# Patient Record
Sex: Female | Born: 1960 | Race: White | Hispanic: No | Marital: Married | Smoking: Never smoker
Health system: Southern US, Community
[De-identification: ages and names within clinical notes are randomized; demographics above are authoritative.]

## PROBLEM LIST (undated history)

## (undated) DIAGNOSIS — I10 Essential (primary) hypertension: Secondary | ICD-10-CM

---

## 2007-05-05 ENCOUNTER — Ambulatory Visit: Payer: Self-pay

## 2008-05-06 ENCOUNTER — Ambulatory Visit: Payer: Self-pay

## 2008-05-11 ENCOUNTER — Ambulatory Visit: Payer: Self-pay

## 2010-07-31 ENCOUNTER — Ambulatory Visit: Payer: Self-pay

## 2010-11-23 ENCOUNTER — Ambulatory Visit: Payer: Self-pay | Admitting: Family Medicine

## 2011-10-13 ENCOUNTER — Ambulatory Visit: Payer: Self-pay | Admitting: Internal Medicine

## 2012-02-24 ENCOUNTER — Ambulatory Visit: Payer: Self-pay | Admitting: Internal Medicine

## 2012-10-30 ENCOUNTER — Ambulatory Visit: Payer: Self-pay | Admitting: Family Medicine

## 2012-10-30 LAB — CBC WITH DIFFERENTIAL/PLATELET
Basophil #: 0.1 10*3/uL (ref 0.0–0.1)
Basophil %: 0.8 %
Eosinophil #: 0 10*3/uL (ref 0.0–0.7)
Eosinophil %: 0.3 %
HCT: 40 % (ref 35.0–47.0)
HGB: 13.6 g/dL (ref 12.0–16.0)
Lymphocyte %: 17.7 %
MCH: 29.7 pg (ref 26.0–34.0)
MCHC: 34.1 g/dL (ref 32.0–36.0)
Monocyte #: 1.1 x10 3/mm — ABNORMAL HIGH (ref 0.2–0.9)
Monocyte %: 10.7 %
Neutrophil #: 7.1 10*3/uL — ABNORMAL HIGH (ref 1.4–6.5)
Neutrophil %: 70.5 %
RBC: 4.59 10*6/uL (ref 3.80–5.20)
RDW: 13.1 % (ref 11.5–14.5)

## 2012-10-30 LAB — URINALYSIS, COMPLETE
Glucose,UR: NEGATIVE mg/dL (ref 0–75)
Leukocyte Esterase: NEGATIVE
Nitrite: NEGATIVE
Protein: NEGATIVE
Specific Gravity: 1.005 (ref 1.003–1.030)

## 2012-10-30 LAB — RAPID INFLUENZA A&B ANTIGENS

## 2014-07-13 ENCOUNTER — Ambulatory Visit: Payer: Self-pay | Admitting: Gastroenterology

## 2014-07-15 ENCOUNTER — Ambulatory Visit: Payer: Self-pay | Admitting: Obstetrics and Gynecology

## 2015-06-29 ENCOUNTER — Other Ambulatory Visit: Payer: Self-pay | Admitting: Obstetrics and Gynecology

## 2015-06-29 DIAGNOSIS — Z1231 Encounter for screening mammogram for malignant neoplasm of breast: Secondary | ICD-10-CM

## 2015-07-19 ENCOUNTER — Ambulatory Visit
Admission: RE | Admit: 2015-07-19 | Discharge: 2015-07-19 | Disposition: A | Discharge status: Home / Self Care | Payer: BLUE CROSS/BLUE SHIELD | Source: Ambulatory Visit | Attending: Obstetrics and Gynecology | Admitting: Obstetrics and Gynecology

## 2015-07-19 DIAGNOSIS — Z1231 Encounter for screening mammogram for malignant neoplasm of breast: Secondary | ICD-10-CM | POA: Diagnosis not present

## 2016-03-04 ENCOUNTER — Ambulatory Visit
Admission: EM | Admit: 2016-03-04 | Discharge: 2016-03-04 | Disposition: A | Discharge status: Home / Self Care | Payer: BLUE CROSS/BLUE SHIELD | Attending: Family Medicine | Admitting: Family Medicine

## 2016-03-04 ENCOUNTER — Encounter: Payer: Self-pay | Admitting: Emergency Medicine

## 2016-03-04 DIAGNOSIS — B9789 Other viral agents as the cause of diseases classified elsewhere: Principal | ICD-10-CM

## 2016-03-04 DIAGNOSIS — J069 Acute upper respiratory infection, unspecified: Secondary | ICD-10-CM

## 2016-03-04 HISTORY — DX: Essential (primary) hypertension: I10

## 2016-03-04 LAB — RAPID INFLUENZA A&B ANTIGENS (ARMC ONLY): INFLUENZA B (ARMC): NEGATIVE

## 2016-03-04 LAB — RAPID INFLUENZA A&B ANTIGENS: Influenza A (ARMC): NEGATIVE

## 2016-03-04 MED ORDER — GUAIFENESIN-CODEINE 100-10 MG/5ML PO SOLN: ORAL | Status: DC

## 2016-03-04 NOTE — ED Notes (Signed)
Patient c/o cough and chest congestion that started yesterday.  Patient reports fevers.

## 2016-03-04 NOTE — ED Provider Notes (Signed)
CSN: [id number]     Arrival date & time 03/04/16  0831 History   None    Chief Complaint  Patient presents with  . Cough   (Consider location/radiation/quality/duration/timing/severity/associated sxs/prior Treatment) Patient is a 55 y.o. female presenting with cough and URI. The history is provided by the patient.  Cough Associated symptoms: fever and rhinorrhea   Associated symptoms: no headaches, no sore throat and no wheezing   URI Presenting symptoms: congestion, cough, fever and rhinorrhea   Presenting symptoms: no sore throat   Severity:  Moderate Onset quality:  Sudden Duration:  1 day Timing:  Constant Progression:  Unchanged Chronicity:  New Relieved by:  Nothing Ineffective treatments:  None tried Associated symptoms: no headaches, no sinus pain and no wheezing   Risk factors: sick contacts   Risk factors: not elderly, no chronic cardiac disease, no chronic kidney disease, no chronic respiratory disease, no diabetes mellitus, no immunosuppression, no recent illness and no recent travel     Past Medical History  Diagnosis Date  . Hypertension    Past Surgical History  Procedure Laterality Date  . Cesarean section     Family History  Problem Relation Age of Onset  . Breast cancer Maternal Aunt 5290   Social History  Substance Use Topics  . Smoking status: Never Smoker   . Smokeless tobacco: None  . Alcohol Use: No   OB History    No data available     Review of Systems  Constitutional: Positive for fever.  HENT: Positive for congestion and rhinorrhea. Negative for sore throat.   Respiratory: Positive for cough. Negative for wheezing.   Neurological: Negative for headaches.    Allergies  Review of patient's allergies indicates no known allergies.  Home Medications   Prior to Admission medications   Medication Sig Start Date End Date Taking? Authorizing Provider  amLODipine (NORVASC) 5 MG tablet Take 5 mg by mouth daily.   Yes Historical Provider,  MD  guaiFENesin-codeine 100-10 MG/5ML syrup 1-2 teaspoons po q 8 hours prn 03/04/16   Payton Mccallumrlando Deshannon Hinchliffe, MD   Meds Ordered and Administered this Visit  Medications - No data to display  BP 131/88 mmHg  Pulse 85  Temp(Src) 99.9 F (37.7 C) (Tympanic)  Resp 16  Ht 5\' 5"  (1.651 m)  Wt 198 lb (89.812 kg)  BMI 32.95 kg/m2  SpO2 98% No data found.   Physical Exam  Constitutional: She is oriented to person, place, and time. She appears well-developed and well-nourished. No distress.  HENT:  Head: Normocephalic.  Right Ear: Tympanic membrane, external ear and ear canal normal.  Left Ear: Tympanic membrane, external ear and ear canal normal.  Nose: Rhinorrhea present.  Mouth/Throat: Mucous membranes are normal. Posterior oropharyngeal erythema present. No oropharyngeal exudate, posterior oropharyngeal edema or tonsillar abscesses.  Eyes: Conjunctivae and EOM are normal. Pupils are equal, round, and reactive to light. Right eye exhibits no discharge. Left eye exhibits no discharge. No scleral icterus.  Neck: Normal range of motion. Neck supple. No JVD present. No tracheal deviation present. No thyromegaly present.  Cardiovascular: Normal rate, regular rhythm, normal heart sounds and intact distal pulses.   No murmur heard. Pulmonary/Chest: Effort normal and breath sounds normal. No stridor. No respiratory distress. She has no wheezes. She has no rales.  Lymphadenopathy:    She has no cervical adenopathy.  Neurological: She is alert and oriented to person, place, and time.  Skin: No rash noted. She is not diaphoretic.  Nursing note and vitals  reviewed.   ED Course  Procedures (including critical care time)  Labs Review Labs Reviewed  RAPID INFLUENZA A&B ANTIGENS [facility] ONLY)    Imaging Review No results found.   Visual Acuity Review  Right Eye Distance:   Left Eye Distance:   Bilateral Distance:    Right Eye Near:   Left Eye Near:    Bilateral Near:         MDM   1.  Viral URI with cough    Discharge Medication List as of 03/04/2016 10:11 AM    START taking these medications   Details  guaiFENesin-codeine 100-10 MG/5ML syrup 1-2 teaspoons po q 8 hours prn, Print       1. Lab results (negative flu) and diagnosis reviewed with patient 2. rx as per orders above; reviewed possible side effects, interactions, risks and benefits  3. Recommend supportive treatment with increased fluids, rest 4. Follow-up prn if symptoms worsen or don't improve    Payton Mccallum, MD 03/04/16 1020

## 2016-06-26 ENCOUNTER — Other Ambulatory Visit: Payer: Self-pay | Admitting: Obstetrics and Gynecology

## 2016-06-26 DIAGNOSIS — Z1231 Encounter for screening mammogram for malignant neoplasm of breast: Secondary | ICD-10-CM

## 2016-07-23 ENCOUNTER — Ambulatory Visit: Admission: RE | Admit: 2016-07-23 | Payer: BLUE CROSS/BLUE SHIELD | Source: Ambulatory Visit

## 2016-07-24 ENCOUNTER — Ambulatory Visit
Admission: RE | Admit: 2016-07-24 | Discharge: 2016-07-24 | Disposition: A | Discharge status: Home / Self Care | Payer: BLUE CROSS/BLUE SHIELD | Source: Ambulatory Visit | Attending: Obstetrics and Gynecology | Admitting: Obstetrics and Gynecology

## 2016-07-24 ENCOUNTER — Other Ambulatory Visit: Payer: Self-pay | Admitting: Obstetrics and Gynecology

## 2016-07-24 DIAGNOSIS — Z1231 Encounter for screening mammogram for malignant neoplasm of breast: Secondary | ICD-10-CM | POA: Diagnosis present

## 2017-01-12 ENCOUNTER — Ambulatory Visit
Admission: EM | Admit: 2017-01-12 | Discharge: 2017-01-12 | Disposition: A | Discharge status: Home / Self Care | Payer: BLUE CROSS/BLUE SHIELD | Attending: Family Medicine | Admitting: Family Medicine

## 2017-01-12 ENCOUNTER — Encounter: Payer: Self-pay | Admitting: Emergency Medicine

## 2017-01-12 DIAGNOSIS — R69 Illness, unspecified: Secondary | ICD-10-CM | POA: Diagnosis not present

## 2017-01-12 DIAGNOSIS — J111 Influenza due to unidentified influenza virus with other respiratory manifestations: Secondary | ICD-10-CM

## 2017-01-12 MED ORDER — HYDROCOD POLST-CPM POLST ER 10-8 MG/5ML PO SUER: 5.0000 mL | Freq: Two times a day (BID) | ORAL | 0 refills | Status: DC | PRN

## 2017-01-12 MED ORDER — IBUPROFEN 800 MG PO TABS
800.0000 mg | ORAL_TABLET | Freq: Once | ORAL | Status: AC
  Administered 2017-01-12: 800 mg via ORAL

## 2017-01-12 MED ORDER — OSELTAMIVIR PHOSPHATE 75 MG PO CAPS: 75.0000 mg | ORAL_CAPSULE | Freq: Two times a day (BID) | ORAL | 0 refills | Status: DC

## 2017-01-12 NOTE — ED Provider Notes (Signed)
[facility]    CSN: [id number] Arrival date & time: 01/12/17  1604     History   Chief Complaint Chief Complaint  Patient presents with  . Cough  . Fever    HPI Anita Clayton is a 56 y.o. female.    Cough  Associated symptoms: fever and myalgias   Associated symptoms: no wheezing   Fever  Associated symptoms: congestion, cough and myalgias   URI  Presenting symptoms: congestion, cough, fatigue and fever   Severity:  Moderate Onset quality:  Sudden Duration:  1 day Timing:  Constant Progression:  Worsening Chronicity:  New Relieved by:  None tried Associated symptoms: myalgias   Associated symptoms: no sinus pain and no wheezing   Risk factors: sick contacts (flu contacts at work place (school))   Risk factors: not elderly, no chronic cardiac disease, no chronic kidney disease, no chronic respiratory disease, no diabetes mellitus, no immunosuppression, no recent illness and no recent travel     Past Medical History:  Diagnosis Date  . Hypertension     There are no active problems to display for this patient.   Past Surgical History:  Procedure Laterality Date  . CESAREAN SECTION      OB History    No data available       Home Medications    Prior to Admission medications   Medication Sig Start Date End Date Taking? Authorizing Provider  amLODipine (NORVASC) 5 MG tablet Take 5 mg by mouth daily.    Historical Provider, MD  chlorpheniramine-HYDROcodone (TUSSIONEX PENNKINETIC ER) 10-8 MG/5ML SUER Take 5 mLs by mouth every 12 (twelve) hours as needed. 01/12/17   Payton Mccallumrlando Lakeem Rozo, MD  oseltamivir (TAMIFLU) 75 MG capsule Take 1 capsule (75 mg total) by mouth 2 (two) times daily. 01/12/17   Payton Mccallumrlando Junella Domke, MD    Family History Family History  Problem Relation Age of Onset  . Breast cancer Maternal Aunt 1890    Social History Social History  Substance Use Topics  . Smoking status: Never Smoker  . Smokeless tobacco: Never Used  . Alcohol use  No     Allergies   Patient has no known allergies.   Review of Systems Review of Systems  Constitutional: Positive for fatigue and fever.  HENT: Positive for congestion. Negative for sinus pain.   Respiratory: Positive for cough. Negative for wheezing.   Musculoskeletal: Positive for myalgias.     Physical Exam Triage Vital Signs ED Triage Vitals  Enc Vitals Group     BP 01/12/17 1617 (!) 182/98     Pulse Rate 01/12/17 1617 80     Resp 01/12/17 1617 16     Temp 01/12/17 1617 (!) 102.1 F (38.9 C)     Temp Source 01/12/17 1617 Oral     SpO2 01/12/17 1617 100 %     Weight 01/12/17 1616 198 lb (89.8 kg)     Height 01/12/17 1616 5\' 4"  (1.626 m)     Head Circumference --      Peak Flow --      Pain Score 01/12/17 1616 0     Pain Loc --      Pain Edu? --      Excl. in GC? --    No data found.   Updated Vital Signs BP (!) 160/80 (BP Location: Left Arm)   Pulse 80   Temp (!) 101.5 F (38.6 C) (Oral)   Resp 16   Ht 5\' 4"  (1.626 m)  Wt 198 lb (89.8 kg)   SpO2 100%   BMI 33.99 kg/m   Visual Acuity Right Eye Distance:   Left Eye Distance:   Bilateral Distance:    Right Eye Near:   Left Eye Near:    Bilateral Near:     Physical Exam  Constitutional: She appears well-developed and well-nourished. No distress.  HENT:  Head: Normocephalic and atraumatic.  Right Ear: Tympanic membrane, external ear and ear canal normal.  Left Ear: Tympanic membrane, external ear and ear canal normal.  Nose: Mucosal edema and rhinorrhea present. No nose lacerations, sinus tenderness, nasal deformity, septal deviation or nasal septal hematoma. No epistaxis.  No foreign bodies. Right sinus exhibits no maxillary sinus tenderness and no frontal sinus tenderness. Left sinus exhibits no maxillary sinus tenderness and no frontal sinus tenderness.  Mouth/Throat: Uvula is midline, oropharynx is clear and moist and mucous membranes are normal. No oropharyngeal exudate.  Eyes: Conjunctivae  and EOM are normal. Pupils are equal, round, and reactive to light. Right eye exhibits no discharge. Left eye exhibits no discharge. No scleral icterus.  Neck: Normal range of motion. Neck supple. No thyromegaly present.  Cardiovascular: Normal rate, regular rhythm and normal heart sounds.   Pulmonary/Chest: Effort normal and breath sounds normal. No respiratory distress. She has no wheezes. She has no rales.  Lymphadenopathy:    She has no cervical adenopathy.  Skin: She is not diaphoretic.  Nursing note and vitals reviewed.    UC Treatments / Results  Labs (all labs ordered are listed, but only abnormal results are displayed) Labs Reviewed - No data to display  EKG  EKG Interpretation None       Radiology No results found.  Procedures Procedures (including critical care time)  Medications Ordered in UC Medications  ibuprofen (ADVIL,MOTRIN) tablet 800 mg (800 mg Oral Given 01/12/17 1620)     Initial Impression / Assessment and Plan / UC Course  I have reviewed the triage vital signs and the nursing notes.  Pertinent labs & imaging results that were available during my care of the patient were reviewed by me and considered in my medical decision making (see chart for details).       Final Clinical Impressions(s) / UC Diagnoses   Final diagnoses:  Influenza-like illness    New Prescriptions Discharge Medication List as of 01/12/2017  5:11 PM    START taking these medications   Details  chlorpheniramine-HYDROcodone (TUSSIONEX PENNKINETIC ER) 10-8 MG/5ML SUER Take 5 mLs by mouth every 12 (twelve) hours as needed., Starting Sat 01/12/2017, Normal    oseltamivir (TAMIFLU) 75 MG capsule Take 1 capsule (75 mg total) by mouth 2 (two) times daily., Starting Sat 01/12/2017, Normal       1. diagnosis reviewed with patient 2. rx as per orders above; reviewed possible side effects, interactions, risks and benefits  3. Recommend supportive treatment with fluids, rest, otc  analgesics 4. Follow-up prn if symptoms worsen or don't improve   Payton Mccallum, MD 01/12/17 1715

## 2017-01-12 NOTE — ED Triage Notes (Signed)
Patient c/o cough, chest congestion, fever, and bodyaches that started last night.

## 2017-07-02 ENCOUNTER — Other Ambulatory Visit: Payer: Self-pay | Admitting: Obstetrics and Gynecology

## 2017-07-02 DIAGNOSIS — Z1231 Encounter for screening mammogram for malignant neoplasm of breast: Secondary | ICD-10-CM

## 2017-07-29 ENCOUNTER — Ambulatory Visit
Admission: RE | Admit: 2017-07-29 | Discharge: 2017-07-29 | Disposition: A | Discharge status: Home / Self Care | Payer: BLUE CROSS/BLUE SHIELD | Source: Ambulatory Visit | Attending: Obstetrics and Gynecology | Admitting: Obstetrics and Gynecology

## 2017-07-29 DIAGNOSIS — Z1231 Encounter for screening mammogram for malignant neoplasm of breast: Secondary | ICD-10-CM | POA: Diagnosis not present

## 2017-11-10 ENCOUNTER — Encounter: Payer: Self-pay | Admitting: Emergency Medicine

## 2017-11-10 ENCOUNTER — Ambulatory Visit
Admission: EM | Admit: 2017-11-10 | Discharge: 2017-11-10 | Disposition: A | Discharge status: Home / Self Care | Payer: BLUE CROSS/BLUE SHIELD | Attending: Emergency Medicine | Admitting: Emergency Medicine

## 2017-11-10 DIAGNOSIS — J0100 Acute maxillary sinusitis, unspecified: Secondary | ICD-10-CM

## 2017-11-10 DIAGNOSIS — H1032 Unspecified acute conjunctivitis, left eye: Secondary | ICD-10-CM | POA: Diagnosis not present

## 2017-11-10 MED ORDER — AMOXICILLIN-POT CLAVULANATE 875-125 MG PO TABS: 1.0000 | ORAL_TABLET | Freq: Two times a day (BID) | ORAL | 0 refills | Status: AC

## 2017-11-10 MED ORDER — POLYMYXIN B-TRIMETHOPRIM 10000-0.1 UNIT/ML-% OP SOLN: 1.0000 [drp] | OPHTHALMIC | 0 refills | Status: AC

## 2017-11-10 NOTE — Discharge Instructions (Signed)
Please take medications as prescribed.  Return to the clinic for any increasing pain, fevers, worsening symptoms or urgent changes in health.

## 2017-11-10 NOTE — ED Triage Notes (Signed)
Sinus pressure, cough, nasal drainage,  Facial feels clogged up for 1 week

## 2017-11-10 NOTE — ED Provider Notes (Signed)
[facility]    CSN: [id number] Arrival date & time: 11/10/17  1001     History   Chief Complaint Chief Complaint  Patient presents with  . Sinusitis    HPI Anita Clayton is a [age] y.o. female presents to the clinic for evaluation of maxillary sinus pain and pressure that has been increasing over the last 7 days.  She has had no improvement of sinus pain, congestion.  Pain is moderate.  She denies any cough, fevers, chest pain, short of breath, abdominal pain.  She has had some left eye redness with matting.  No vision changes.    HPI  Past Medical History:  Diagnosis Date  . Hypertension     There are no active problems to display for this patient.   Past Surgical History:  Procedure Laterality Date  . CESAREAN SECTION      OB History    No data available       Home Medications    Prior to Admission medications   Medication Sig Start Date End Date Taking? Authorizing Provider  amLODipine (NORVASC) 5 MG tablet Take 5 mg by mouth daily.   Yes [provider]  amoxicillin-clavulanate (AUGMENTIN) 875-125 MG tablet Take 1 tablet by mouth every 12 (twelve) hours for 10 days. 11/10/17 11/20/17  Evon SlackGaines, Kirrah Mustin C, PA-C  chlorpheniramine-HYDROcodone (TUSSIONEX PENNKINETIC ER) 10-8 MG/5ML SUER Take 5 mLs by mouth every 12 (twelve) hours as needed. 01/12/17   Payton Mccallumonty, [place], MD  oseltamivir (TAMIFLU) 75 MG capsule Take 1 capsule (75 mg total) by mouth 2 (two) times daily. 01/12/17   Payton Mccallumonty, [place], MD  trimethoprim-polymyxin b (POLYTRIM) ophthalmic solution Place 1 drop into the left eye every 4 (four) hours for 7 days. 11/10/17 11/17/17  Evon SlackGaines, Viviano Bir C, PA-C    Family History Family History  Problem Relation Age of Onset  . Breast cancer Maternal Aunt 90  . Hypertension Mother     Social History Social History   Tobacco Use  . Smoking status: Never Smoker  . Smokeless tobacco: Never Used  Substance Use Topics  . Alcohol use: No  . Drug use:  No     Allergies   Patient has no known allergies.   Review of Systems Review of Systems  Constitutional: Negative for fever.  HENT: Positive for congestion, rhinorrhea, sinus pressure, sinus pain and sore throat. Negative for ear discharge, trouble swallowing and voice change.   Eyes: Positive for discharge and redness. Negative for photophobia, pain and visual disturbance.  Respiratory: Negative for cough, shortness of breath, wheezing and stridor.   Cardiovascular: Negative for chest pain.  Gastrointestinal: Negative for abdominal pain, diarrhea, nausea and vomiting.  Genitourinary: Negative for dysuria, flank pain and pelvic pain.  Musculoskeletal: Positive for myalgias. Negative for back pain.  Skin: Negative for rash.  Neurological: Negative for dizziness and headaches.     Physical Exam Triage Vital Signs ED Triage Vitals  Enc Vitals Group     BP 11/10/17 1049 135/85     Pulse Rate 11/10/17 1049 79     Resp 11/10/17 1049 16     Temp 11/10/17 1049 98 F (36.7 C)     Temp Source 11/10/17 1049 Oral     SpO2 11/10/17 1049 98 %     Weight 11/10/17 1049 200 lb (90.7 kg)     Height 11/10/17 1049 5\' 5"  (1.651 m)     Head Circumference --      Peak Flow --  Pain Score 11/10/17 1051 4     Pain Loc --      Pain Edu? --      Excl. in GC? --    No data found.  Updated Vital Signs BP 135/85 (BP Location: Left Arm)   Pulse 79   Temp 98 F (36.7 C) (Oral)   Resp 16   Ht 5\' 5"  (1.651 m)   Wt 200 lb (90.7 kg)   SpO2 98%   BMI 33.28 kg/m   Visual Acuity Right Eye Distance:   Left Eye Distance:   Bilateral Distance:    Right Eye Near:   Left Eye Near:    Bilateral Near:     Physical Exam  Constitutional: She is oriented to person, place, and time. She appears well-developed and well-nourished. No distress.  HENT:  Head: Normocephalic and atraumatic.  Right Ear: Hearing, tympanic membrane, external ear and ear canal normal.  Left Ear: Hearing, tympanic  membrane, external ear and ear canal normal.  Nose: Rhinorrhea present.  Mouth/Throat: Mucous membranes are normal. No trismus in the jaw. No uvula swelling. Posterior oropharyngeal erythema present. No oropharyngeal exudate, posterior oropharyngeal edema or tonsillar abscesses. No tonsillar exudate.  Positive maxillary sinus tenderness to percussion.  No frontal sinus tenderness.  No pharyngeal erythema or exudates.  Eyes: Right eye exhibits no discharge. Left eye exhibits no discharge. Right conjunctiva is not injected. Left conjunctiva is injected.  Neck: Normal range of motion.  Cardiovascular: Normal rate and regular rhythm.  Pulmonary/Chest: Effort normal and breath sounds normal. No stridor. No respiratory distress. She has no wheezes. She has no rales.  Abdominal: Soft. She exhibits no distension. There is no tenderness.  Musculoskeletal: Normal range of motion. She exhibits no deformity.  Lymphadenopathy:    She has cervical adenopathy.  Neurological: She is alert and oriented to person, place, and time. She has normal reflexes.  Skin: Skin is warm and dry.  Psychiatric: She has a normal mood and affect. Her behavior is normal. Thought content normal.     UC Treatments / Results  Labs (all labs ordered are listed, but only abnormal results are displayed) Labs Reviewed - No data to display  EKG  EKG Interpretation None       Radiology No results found.  Procedures Procedures (including critical care time)  Medications Ordered in UC Medications - No data to display   Initial Impression / Assessment and Plan / UC Course  I have reviewed the triage vital signs and the nursing notes.  Pertinent labs & imaging results that were available during my care of the patient were reviewed by me and considered in my medical decision making (see chart for details).    56 year old female with acute maxillary sinusitis.  Patient started on Augmentin for 10 days.  She is also with  left eye conjunctivitis.  Will start polymycin drops.  She is educated signs symptoms return to the clinic for.  Final Clinical Impressions(s) / UC Diagnoses   Final diagnoses:  Acute non-recurrent maxillary sinusitis  Acute bacterial conjunctivitis of left eye    ED Discharge Orders        Ordered    amoxicillin-clavulanate (AUGMENTIN) 875-125 MG tablet  Every 12 hours     11/10/17 1104    trimethoprim-polymyxin b (POLYTRIM) ophthalmic solution  Every 4 hours     11/10/17 1104       Evon SlackGaines, Tekelia Kareem C, [place]PA-C 11/10/17 1109

## 2018-01-26 ENCOUNTER — Other Ambulatory Visit: Payer: Self-pay

## 2018-01-26 ENCOUNTER — Ambulatory Visit
Admission: EM | Admit: 2018-01-26 | Discharge: 2018-01-26 | Disposition: A | Discharge status: Home / Self Care | Payer: BLUE CROSS/BLUE SHIELD | Attending: Family Medicine | Admitting: Family Medicine

## 2018-01-26 DIAGNOSIS — R062 Wheezing: Secondary | ICD-10-CM | POA: Diagnosis not present

## 2018-01-26 DIAGNOSIS — R05 Cough: Secondary | ICD-10-CM

## 2018-01-26 DIAGNOSIS — R509 Fever, unspecified: Secondary | ICD-10-CM | POA: Diagnosis not present

## 2018-01-26 DIAGNOSIS — J111 Influenza due to unidentified influenza virus with other respiratory manifestations: Secondary | ICD-10-CM

## 2018-01-26 DIAGNOSIS — R0981 Nasal congestion: Secondary | ICD-10-CM

## 2018-01-26 DIAGNOSIS — R69 Illness, unspecified: Secondary | ICD-10-CM | POA: Diagnosis not present

## 2018-01-26 DIAGNOSIS — R5383 Other fatigue: Secondary | ICD-10-CM | POA: Diagnosis not present

## 2018-01-26 MED ORDER — BENZONATATE 100 MG PO CAPS: 100.0000 mg | ORAL_CAPSULE | Freq: Three times a day (TID) | ORAL | 0 refills | Status: DC | PRN

## 2018-01-26 MED ORDER — ALBUTEROL SULFATE HFA 108 (90 BASE) MCG/ACT IN AERS: 2.0000 | INHALATION_SPRAY | Freq: Four times a day (QID) | RESPIRATORY_TRACT | 0 refills | Status: AC | PRN

## 2018-01-26 MED ORDER — IPRATROPIUM-ALBUTEROL 0.5-2.5 (3) MG/3ML IN SOLN
3.0000 mL | Freq: Once | RESPIRATORY_TRACT | Status: AC
  Administered 2018-01-26: 3 mL via RESPIRATORY_TRACT

## 2018-01-26 NOTE — ED Provider Notes (Signed)
[facility]    CSN: [id number] Arrival date & time: 01/26/18  4098     History   Chief Complaint Chief Complaint  Patient presents with  . Nasal Congestion    HPI Anita Clayton is a 57 y.o. female.   57 year old female presents with nasal congestion, cough and low grade fever for the past 2-3 days. Minimal congestion but has noticed more wheezing yesterday and today. Denies any GI symptoms. Has not taken any medications for symptoms. History of bad sinus infection in Nov 2018 and influenza about 1 year ago and concerned over etiology. No history of Asthma.  Does not smoke. Also had pneumonia about 3 years ago and the heavy feeling in her chest feels similar to previous episode. Works in [employer] and has been exposed to influenza, strep and viral illnesses. Only other chronic health issue is HTN and takes Norvasc daily.    The history is provided by the patient.    Past Medical History:  Diagnosis Date  . Hypertension     There are no active problems to display for this patient.   Past Surgical History:  Procedure Laterality Date  . CESAREAN SECTION      OB History    No data available       Home Medications    Prior to Admission medications   Medication Sig Start Date End Date Taking? Authorizing Provider  albuterol (PROVENTIL HFA;VENTOLIN HFA) 108 (90 Base) MCG/ACT inhaler Inhale 2 puffs into the lungs every 6 (six) hours as needed for wheezing or shortness of breath. 01/26/18   Sudie Grumbling, NP  amLODipine (NORVASC) 5 MG tablet Take 5 mg by mouth daily.    [provider]  benzonatate (TESSALON) 100 MG capsule Take 1 capsule (100 mg total) by mouth 3 (three) times daily as needed for cough. 01/26/18   Sudie Grumbling, NP    Family History Family History  Problem Relation Age of Onset  . Breast cancer Maternal Aunt 90  . Hypertension Mother     Social History Social History   Tobacco Use  . Smoking status: Never Smoker    . Smokeless tobacco: Never Used  Substance Use Topics  . Alcohol use: No  . Drug use: No     Allergies   Patient has no known allergies.   Review of Systems Review of Systems  Constitutional: Positive for appetite change, chills, fatigue and fever.  HENT: Positive for congestion, postnasal drip, rhinorrhea and sinus pressure. Negative for ear discharge, ear pain, facial swelling, mouth sores, nosebleeds, sinus pain, sore throat and trouble swallowing.   Eyes: Negative for pain, discharge, redness and itching.  Respiratory: Positive for cough, chest tightness and wheezing. Negative for shortness of breath.   Gastrointestinal: Negative for abdominal pain, diarrhea, nausea and vomiting.  Musculoskeletal: Positive for arthralgias. Negative for back pain, neck pain and neck stiffness.  Skin: Negative for rash and wound.  Allergic/Immunologic: Negative for immunocompromised state.  Neurological: Negative for dizziness, tremors, seizures, syncope, weakness, light-headedness, numbness and headaches.  Hematological: Negative for adenopathy. Does not bruise/bleed easily.     Physical Exam Triage Vital Signs ED Triage Vitals  Enc Vitals Group     BP 01/26/18 0857 130/89     Pulse Rate 01/26/18 0857 76     Resp 01/26/18 0857 18     Temp 01/26/18 0857 98.2 F (36.8 C)     Temp Source 01/26/18 0857 Oral     SpO2  01/26/18 0857 99 %     Weight 01/26/18 0859 198 lb (89.8 kg)     Height 01/26/18 0859 5\' 6"  (1.676 m)     Head Circumference --      Peak Flow --      Pain Score --      Pain Loc --      Pain Edu? --      Excl. in GC? --    No data found.  Updated Vital Signs BP 130/89 (BP Location: Left Arm)   Pulse 76   Temp 98.2 F (36.8 C) (Oral)   Resp 18   Ht 5\' 6"  (1.676 m)   Wt 198 lb (89.8 kg)   SpO2 99%   BMI 31.96 kg/m   Visual Acuity Right Eye Distance:   Left Eye Distance:   Bilateral Distance:    Right Eye Near:   Left Eye Near:    Bilateral Near:      Physical Exam  Constitutional: She is oriented to person, place, and time. She appears well-developed and well-nourished. No distress.  HENT:  Head: Normocephalic and atraumatic.  Right Ear: Hearing, tympanic membrane, external ear and ear canal normal.  Left Ear: Hearing, tympanic membrane, external ear and ear canal normal.  Nose: Mucosal edema and rhinorrhea present. Right sinus exhibits no maxillary sinus tenderness and no frontal sinus tenderness. Left sinus exhibits no maxillary sinus tenderness and no frontal sinus tenderness.  Mouth/Throat: Uvula is midline and mucous membranes are normal. Posterior oropharyngeal erythema (slight) present.  Eyes: Conjunctivae and EOM are normal.  Neck: Normal range of motion. Neck supple.  Cardiovascular: Normal rate, regular rhythm and normal heart sounds.  No murmur heard. Pulmonary/Chest: Effort normal. No accessory muscle usage or stridor. No respiratory distress. She has decreased breath sounds in the right upper field, the right lower field, the left upper field and the left lower field. She has wheezes in the right upper field, the right lower field, the left upper field and the left lower field. She has no rhonchi. She has no rales.  Musculoskeletal: Normal range of motion.  Lymphadenopathy:    She has no cervical adenopathy.  Neurological: She is alert and oriented to person, place, and time.  Skin: Skin is warm and dry. Capillary refill takes less than 2 seconds. No rash noted.  Psychiatric: She has a normal mood and affect. Her behavior is normal. Judgment and thought content normal.     UC Treatments / Results  Labs (all labs ordered are listed, but only abnormal results are displayed) Labs Reviewed - No data to display  EKG  EKG Interpretation None       Radiology No results found.  Procedures Procedures (including critical care time)  Medications Ordered in UC Medications  ipratropium-albuterol (DUONEB) 0.5-2.5 (3)  MG/3ML nebulizer solution 3 mL (3 mLs Nebulization Given 01/26/18 0955)     Initial Impression / Assessment and Plan / UC Course  I have reviewed the triage vital signs and the nursing notes.  Pertinent labs & imaging results that were available during my care of the patient were reviewed by me and considered in my medical decision making (see chart for details).    Patient indicated that it was easier to breathe and less wheezing after nebulizer treatment. Upon exam, still slight wheezes all lung fields but improved. Discussed with patient that she probably has a viral illness. Do not feel that she has pneumonia. Offered Tamiflu but patient declined. Will trial Tessalon cough  pills 1 every 8 hours as needed. Increase fluid intake to help loosen up mucus. May use Albuterol inhaler 2 puffs every 6 hours as needed for cough or wheezing. May also use Mucinex DM every 12 hours as needed. Note written for work for tomorrow. Recommend follow-up with her PCP in 3 days if not improving or sooner if symptoms worsen.    Final Clinical Impressions(s) / UC Diagnoses   Final diagnoses:  Influenza-like illness    ED Discharge Orders        Ordered    benzonatate (TESSALON) 100 MG capsule  3 times daily PRN     01/26/18 1033    albuterol (PROVENTIL HFA;VENTOLIN HFA) 108 (90 Base) MCG/ACT inhaler  Every 6 hours PRN     01/26/18 1033       Controlled Substance Prescriptions [place] Controlled Substance Registry consulted? Not Applicable   Sudie Grumblingmyot, Myrian Botello Berry, NP 01/26/18 1925

## 2018-01-26 NOTE — Discharge Instructions (Addendum)
Recommend start Tessalon cough pills 1 every 8 hours as needed. Increase fluid intake to help loosen up mucus. May use Albuterol inhaler 2 puffs every 6 hours as needed for cough or wheezing. May also use Mucinex DM every 12 hours as needed. Recommend follow-up with your PCP in 3 days if not improving or sooner if symptoms worsen.

## 2018-01-26 NOTE — ED Triage Notes (Addendum)
Pt reports nasal and chest congestion and cough. Harder to take a deep breath and feels heavy. Has had pneumonia in the past and concerned for recurrence. Pain 0/10. Reports low grade fever. Sx started Friday night

## 2018-07-08 ENCOUNTER — Other Ambulatory Visit: Payer: Self-pay | Admitting: Nurse Practitioner

## 2018-07-08 DIAGNOSIS — Z1231 Encounter for screening mammogram for malignant neoplasm of breast: Secondary | ICD-10-CM

## 2018-08-04 ENCOUNTER — Ambulatory Visit
Admission: RE | Admit: 2018-08-04 | Discharge: 2018-08-04 | Disposition: A | Discharge status: Home / Self Care | Payer: BLUE CROSS/BLUE SHIELD | Source: Ambulatory Visit | Attending: Nurse Practitioner | Admitting: Nurse Practitioner

## 2018-08-04 ENCOUNTER — Encounter (INDEPENDENT_AMBULATORY_CARE_PROVIDER_SITE_OTHER): Payer: Self-pay

## 2018-08-04 DIAGNOSIS — Z1231 Encounter for screening mammogram for malignant neoplasm of breast: Secondary | ICD-10-CM | POA: Diagnosis not present

## 2019-07-15 ENCOUNTER — Other Ambulatory Visit: Payer: Self-pay | Admitting: Nurse Practitioner

## 2019-07-15 DIAGNOSIS — Z1231 Encounter for screening mammogram for malignant neoplasm of breast: Secondary | ICD-10-CM

## 2019-12-23 ENCOUNTER — Other Ambulatory Visit: Payer: Self-pay

## 2019-12-23 ENCOUNTER — Encounter (INDEPENDENT_AMBULATORY_CARE_PROVIDER_SITE_OTHER): Payer: Self-pay

## 2019-12-23 ENCOUNTER — Ambulatory Visit
Admission: RE | Admit: 2019-12-23 | Discharge: 2019-12-23 | Disposition: A | Discharge status: Home / Self Care | Payer: BC Managed Care – PPO | Source: Ambulatory Visit | Attending: Nurse Practitioner | Admitting: Nurse Practitioner

## 2019-12-23 DIAGNOSIS — Z1231 Encounter for screening mammogram for malignant neoplasm of breast: Secondary | ICD-10-CM | POA: Diagnosis present

## 2021-06-21 ENCOUNTER — Other Ambulatory Visit: Payer: Self-pay | Admitting: Nurse Practitioner

## 2021-06-21 DIAGNOSIS — Z1231 Encounter for screening mammogram for malignant neoplasm of breast: Secondary | ICD-10-CM

## 2021-06-28 ENCOUNTER — Other Ambulatory Visit: Payer: Self-pay

## 2021-06-28 ENCOUNTER — Ambulatory Visit
Admission: RE | Admit: 2021-06-28 | Discharge: 2021-06-28 | Disposition: A | Discharge status: Home / Self Care | Payer: BC Managed Care – PPO | Source: Ambulatory Visit | Attending: Nurse Practitioner | Admitting: Nurse Practitioner

## 2021-06-28 DIAGNOSIS — Z1231 Encounter for screening mammogram for malignant neoplasm of breast: Secondary | ICD-10-CM | POA: Diagnosis present

## 2023-07-01 IMAGING — MG MM DIGITAL SCREENING BILAT W/ TOMO AND CAD
8 series · 8 of 24 positions shown · non-contrast
Comparison: Previous exam(s).

CLINICAL DATA: Screening.

EXAM:
DIGITAL SCREENING BILATERAL MAMMOGRAM WITH TOMOSYNTHESIS AND CAD
TECHNIQUE: Bilateral screening digital craniocaudal and mediolateral oblique
mammograms were obtained. Bilateral screening digital breast
tomosynthesis was performed. The images were evaluated with
computer-aided detection.

[L CC synth-2D]
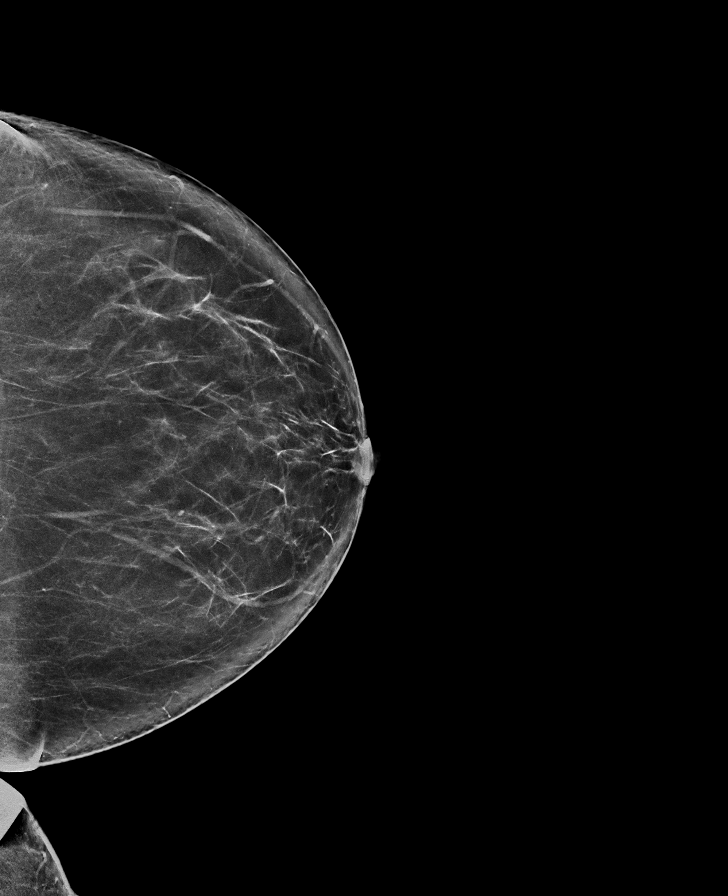

[R MLO synth-2D]
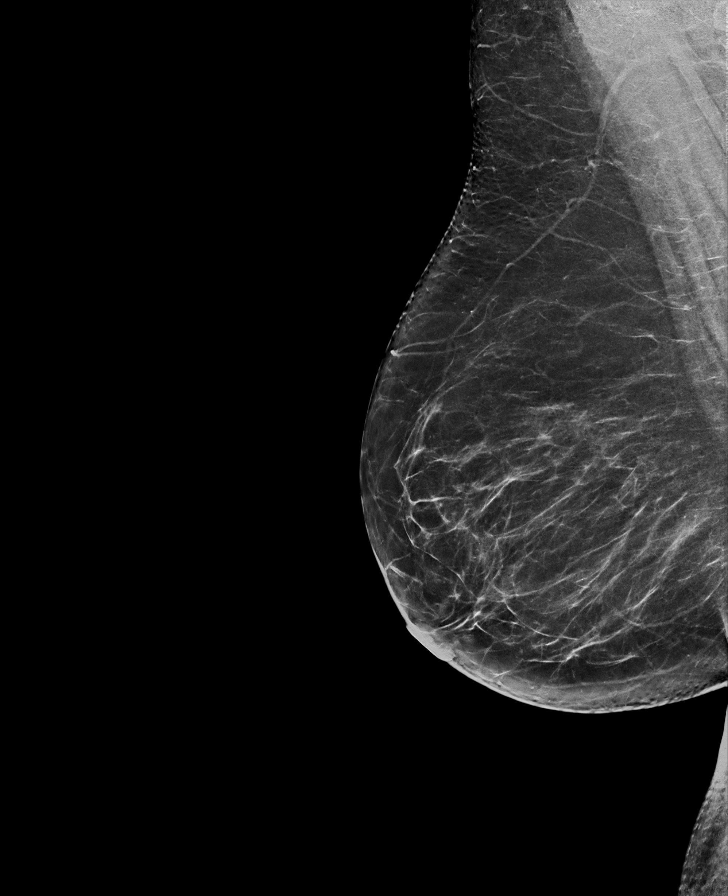

[L MLO synth-2D]
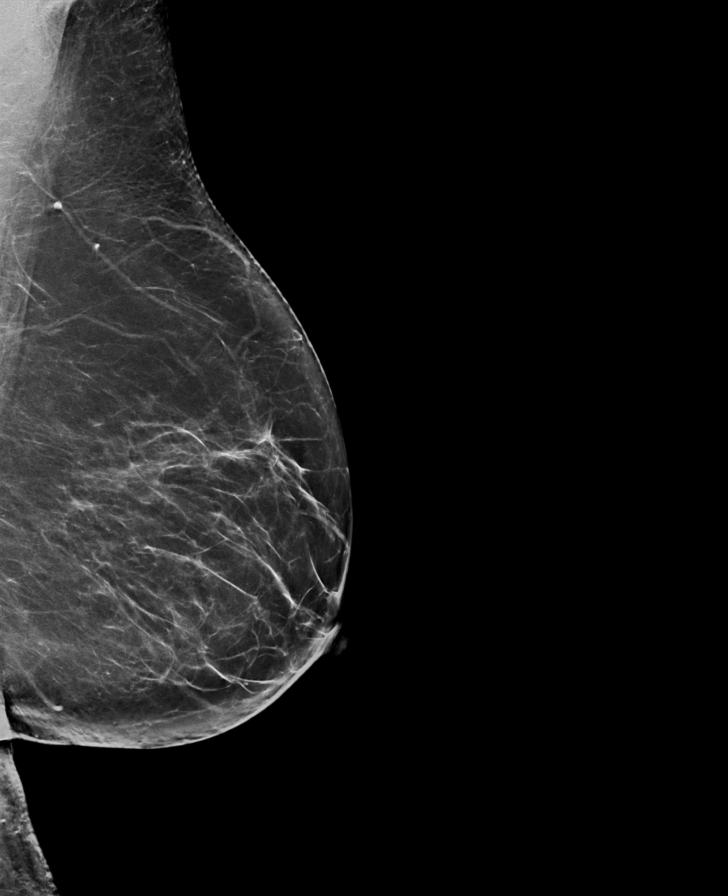

[R CC synth-2D]
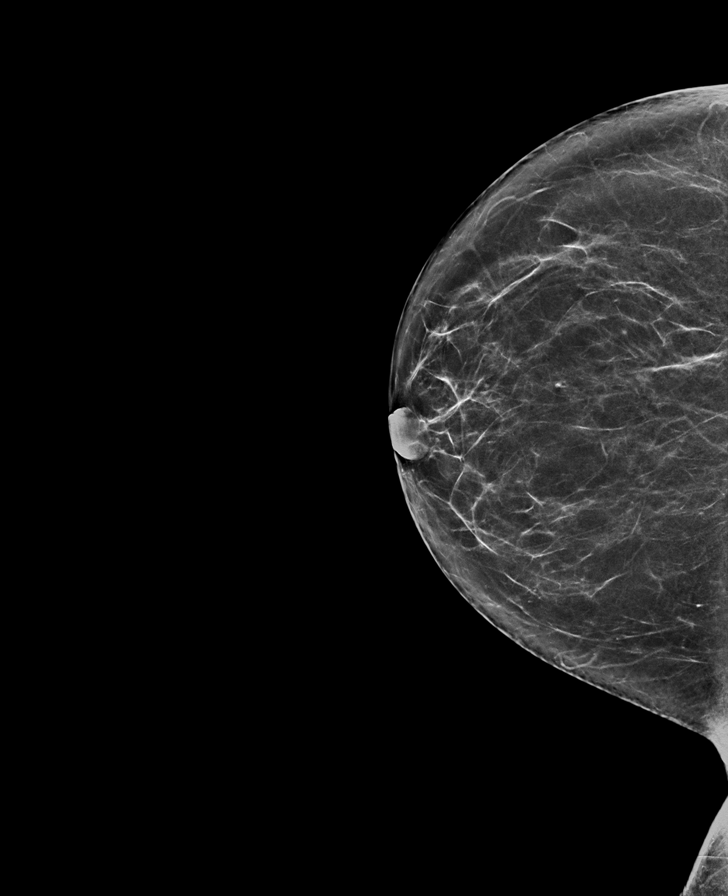

[R CC tomo · tomo slice 35/68.0]
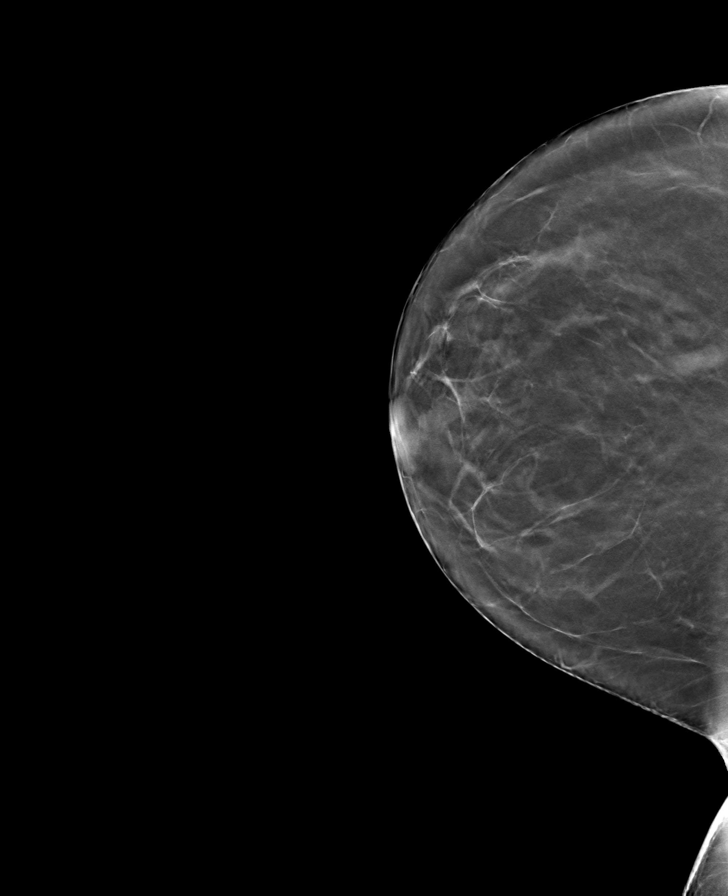

[R MLO tomo · tomo slice 39/78.0]
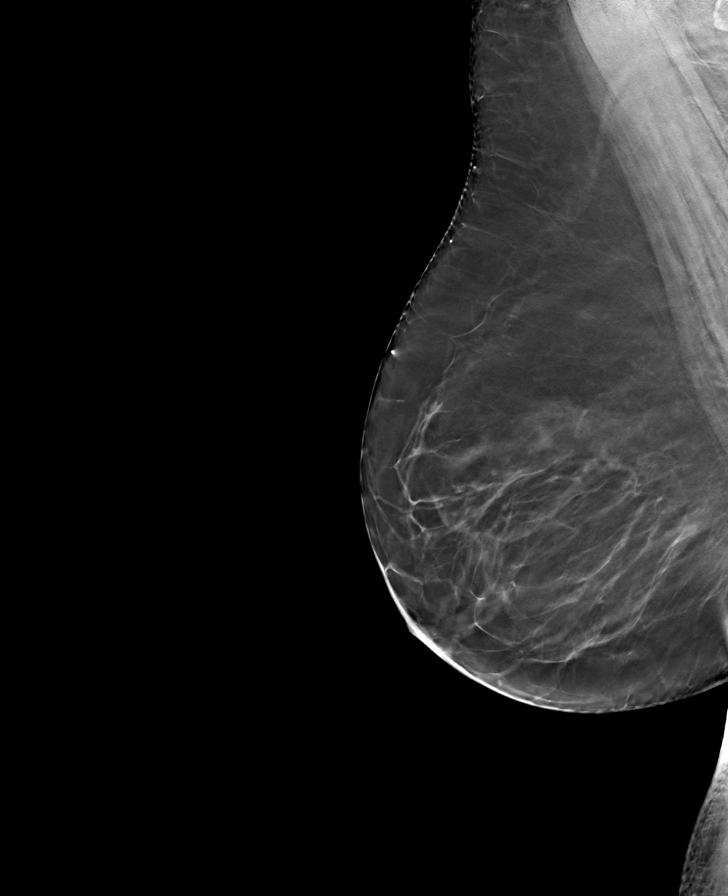

[L MLO tomo · tomo slice 39/76.0]
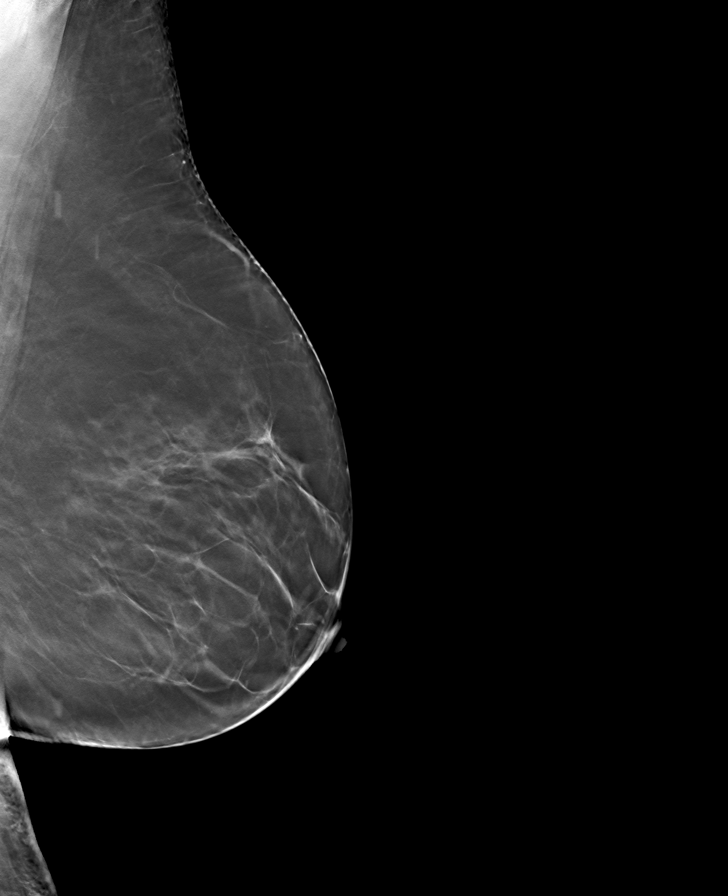

[L CC tomo · tomo slice 37/74.0]
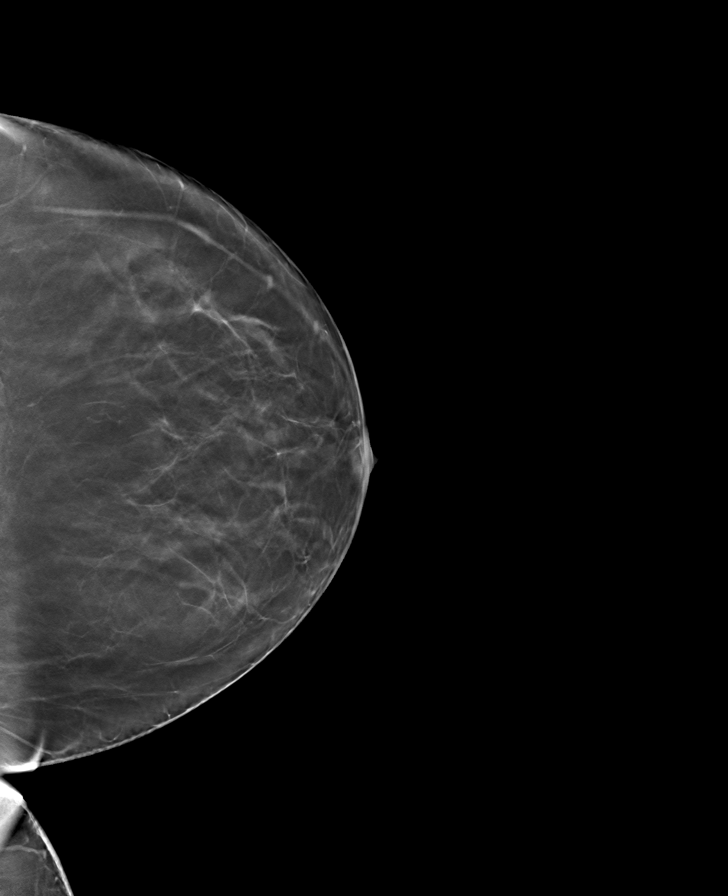

[8 of 24 positions shown; findings below may reference images not displayed]

ACR Breast Density Category b: There are scattered areas of
fibroglandular density.
FINDINGS: There are no findings suspicious for malignancy.
IMPRESSION: No mammographic evidence of malignancy. A result letter of this
screening mammogram will be mailed directly to the patient.

RECOMMENDATION:
Screening mammogram in one year. (Code:51-O-LD2)

BI-RADS CATEGORY  1: Negative.

## 2023-07-03 ENCOUNTER — Other Ambulatory Visit: Payer: Self-pay | Admitting: Nurse Practitioner

## 2023-07-03 DIAGNOSIS — Z1231 Encounter for screening mammogram for malignant neoplasm of breast: Secondary | ICD-10-CM

## 2023-07-10 ENCOUNTER — Ambulatory Visit
Admission: RE | Admit: 2023-07-10 | Discharge: 2023-07-10 | Disposition: A | Discharge status: Home / Self Care | Payer: BC Managed Care – PPO | Source: Ambulatory Visit | Attending: Nurse Practitioner | Admitting: Nurse Practitioner

## 2023-07-10 DIAGNOSIS — Z1231 Encounter for screening mammogram for malignant neoplasm of breast: Secondary | ICD-10-CM | POA: Insufficient documentation

## 2024-06-12 ENCOUNTER — Ambulatory Visit
Admission: EM | Admit: 2024-06-12 | Discharge: 2024-06-12 | Disposition: A | Discharge status: Home / Self Care | Attending: Family Medicine | Admitting: Family Medicine

## 2024-06-12 DIAGNOSIS — H6991 Unspecified Eustachian tube disorder, right ear: Secondary | ICD-10-CM | POA: Diagnosis not present

## 2024-06-12 DIAGNOSIS — R42 Dizziness and giddiness: Secondary | ICD-10-CM | POA: Diagnosis not present

## 2024-06-12 MED ORDER — ONDANSETRON 4 MG PO TBDP: 4.0000 mg | ORAL_TABLET | Freq: Three times a day (TID) | ORAL | 0 refills | Status: AC | PRN

## 2024-06-12 MED ORDER — MECLIZINE HCL 12.5 MG PO TABS: 25.0000 mg | ORAL_TABLET | Freq: Three times a day (TID) | ORAL | 0 refills | Status: AC | PRN

## 2024-06-12 MED ORDER — PREDNISONE 10 MG (21) PO TBPK: ORAL_TABLET | Freq: Every day | ORAL | 0 refills | Status: AC

## 2024-06-12 NOTE — ED Provider Notes (Signed)
 [facility]    CSN: [id number] Arrival date & time: 06/12/24  1043      History   Chief Complaint Chief Complaint  Patient presents with   Ear Problem    HPI Anita Clayton is a 63 y.o. female.   HPI   Anita Clayton presents for non-room spinning dizziness after waking up last Saturday. She was walking the dog on Saturday then needed to call her husband to pick her up. She was about a block away.  She had trouble walking and turning her head without getting nauseous.  She slept in a recliner for the those days but returned to her bed and the next morning her symptoms got worse.   Feels like a fog in her eye and her eyes feel heavy. No confusion or blurry vision. She has been taking more naps which she usually doesn't take.  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   Denies fever, chills and headache. She vomited once. All the other times were just dry heaves. Endorses rhinorrhea and used Flonase. Kinda feel fluid in there.    Has history of vertigo.    Past Medical History:  Diagnosis Date   Hypertension     There are no active problems to display for this patient.   Past Surgical History:  Procedure Laterality Date   CESAREAN SECTION      OB History   No obstetric history on file.      Home Medications    Prior to Admission medications   Medication Sig Start Date End Date Taking? Authorizing Provider  amLODipine (NORVASC) 5 MG tablet Take 5 mg by mouth daily.   Yes [provider]  meclizine (ANTIVERT) 12.5 MG tablet Take 2 tablets (25 mg total) by mouth 3 (three) times daily as needed for dizziness. 06/12/24  Yes Marga Gramajo, DO  ondansetron (ZOFRAN-ODT) 4 MG disintegrating tablet Take 1 tablet (4 mg total) by mouth every 8 (eight) hours as needed. 06/12/24  Yes Saren Corkern, DO  predniSONE (STERAPRED UNI-PAK 21 TAB) 10 MG (21) TBPK tablet Take by mouth daily. Take 6 tabs by mouth daily for 1, then 5 tabs for 1 day,  then 4 tabs for 1 day, then 3 tabs for 1 day, then 2 tabs for 1 day, then 1 tab for 1 day. 06/12/24  Yes Ayushi Pla, DO  albuterol  (PROVENTIL  HFA;VENTOLIN  HFA) 108 (90 Base) MCG/ACT inhaler Inhale 2 puffs into the lungs every 6 (six) hours as needed for wheezing or shortness of breath. 01/26/18   Pearl Jenkins Lesches, NP    Family History Family History  Problem Relation Age of Onset   Breast cancer Maternal Aunt 47   Hypertension Mother     Social History Social History   Tobacco Use   Smoking status: Never   Smokeless tobacco: Never  Vaping Use   Vaping status: Never Used  Substance Use Topics   Alcohol use: No   Drug use: No     Allergies   Patient has no known allergies.   Review of Systems Review of Systems: negative unless otherwise stated in HPI.      Physical Exam Triage Vital Signs ED Triage Vitals  Encounter Vitals Group     BP 06/12/24 1106 126/83     Girls Systolic BP Percentile --      Girls Diastolic BP Percentile --      Boys Systolic BP Percentile --      Boys  Diastolic BP Percentile --      Pulse Rate 06/12/24 1106 65     Resp --      Temp 06/12/24 1106 99.1 F (37.3 C)     Temp Source 06/12/24 1106 Oral     SpO2 06/12/24 1106 100 %     Weight 06/12/24 1107 184 lb (83.5 kg)     Height 06/12/24 1107 5' 4 (1.626 m)     Head Circumference --      Peak Flow --      Pain Score 06/12/24 1107 0     Pain Loc --      Pain Education --      Exclude from Growth Chart --    No data found.  Updated Vital Signs BP 126/83 (BP Location: Left Arm)   Pulse 65   Temp 99.1 F (37.3 C) (Oral)   Ht 5' 4 (1.626 m)   Wt 83.5 kg   SpO2 100%   BMI 31.58 kg/m   Visual Acuity Right Eye Distance:   Left Eye Distance:   Bilateral Distance:    Right Eye Near:   Left Eye Near:    Bilateral Near:     Physical Exam GEN:     alert, well appearing female and no distress    HENT:  mucus membranes moist, oropharyngeal without lesions or erythema,  nares  patent, no nasal discharge, right TM effusion, left TM normal EYES:   pupils equal and reactive, EOM intact, no nystagmus  NECK:  supple, normal ROM RESP:  clear to auscultation bilaterally, no increased work of breathing  CVS:   regular rate and rhythm, no murmur, no carotid bruit  NEURO:  alert, oriented, speech normal, CN 2-12 grossly intact, no facial droop,  sensation grossly intact, strength 5/5 bilateral UE and LE, normal coordination, normal finger to nose, positive Romberg , normal gait Skin:   warm and dry, no rash      UC Treatments / Results  Labs (all labs ordered are listed, but only abnormal results are displayed) Labs Reviewed - No data to display  EKG  If EKG performed, see my interpretation in the MDM section  Radiology No results found.   Procedures Procedures (including critical care time)  Medications Ordered in UC Medications - No data to display  Initial Impression / Assessment and Plan / UC Course  I have reviewed the triage vital signs and the nursing notes.  Pertinent labs & imaging results that were available during my care of the patient were reviewed by me and considered in my medical decision making (see chart for details).       Pt is a 63 y.o. female with history of vertigo who presents for intermittent dizziness for the past 5 days.  She does not have a headache. There has been no syncope.  I doubt acute cerebellar CVA. History not consistent with complex migraine. She does not have upper respiratory symptoms to suggest labyrinthitis. No hearing loss to suggest Mnire's disease. No foreign bodies seen. Most recent TSH was normal in July 2024. Has no recent head imaging available for review. On exam, she has signs of vertigo and an acute right eustachian tube dysfunction.    Treat with meclizine and prednisone.  Showed patient how to perform Epley maneuver at home.  Handout also given. Zofran prescribed for nasuea.    Strict ED precautions  reviewed and patient voiced understanding. Pt may need vestibular rehab or see ENT, if symptoms not improving.  She will follow up with her PCP.    Final Clinical Impressions(s) / UC Diagnoses   Final diagnoses:  Acute dysfunction of right eustachian tube  Dizziness     Discharge Instructions      Stop by the pharmacy to pick up your prescriptions.  Follow up with your primary care provider, if not improving.  Go to the hospital emergency department for headache with dizziness, nausea/vomiting or persistent balance issues.  Go to Youtube and search Epley maneuver. See handout for details.  Take meclizine as needed and prednisone as prescribed.  May need to see a specialist physical therapist for neurovestibular rehab.       ED Prescriptions     Medication Sig Dispense Auth. Provider   meclizine (ANTIVERT) 12.5 MG tablet Take 2 tablets (25 mg total) by mouth 3 (three) times daily as needed for dizziness. 30 tablet Verdell Dykman, DO   predniSONE (STERAPRED UNI-PAK 21 TAB) 10 MG (21) TBPK tablet Take by mouth daily. Take 6 tabs by mouth daily for 1, then 5 tabs for 1 day, then 4 tabs for 1 day, then 3 tabs for 1 day, then 2 tabs for 1 day, then 1 tab for 1 day. 21 tablet Hau Sanor, DO   ondansetron (ZOFRAN-ODT) 4 MG disintegrating tablet Take 1 tablet (4 mg total) by mouth every 8 (eight) hours as needed. 20 tablet Rotha Cassels, DO      PDMP not reviewed this encounter.   Kriste Berth, DO 06/12/24 1149

## 2024-06-12 NOTE — ED Triage Notes (Signed)
 Pt c/o lightheadedness, dizziness x1week  Pt states that her head is in a fog and if I move too quickly I get dizzy  Pt believes that she has a inner ear issue.

## 2024-06-12 NOTE — Discharge Instructions (Addendum)
 Stop by the pharmacy to pick up your prescriptions.  Follow up with your primary care provider, if not improving.  Go to the hospital emergency department for headache with dizziness, nausea/vomiting or persistent balance issues.  Go to Youtube and search Epley maneuver. See handout for details.  Take meclizine as needed and prednisone as prescribed.  May need to see a specialist physical therapist for neurovestibular rehab.

## 2024-07-07 ENCOUNTER — Other Ambulatory Visit: Payer: Self-pay | Admitting: Nurse Practitioner

## 2024-07-07 DIAGNOSIS — Z1231 Encounter for screening mammogram for malignant neoplasm of breast: Secondary | ICD-10-CM

## 2024-08-19 ENCOUNTER — Ambulatory Visit
Admission: RE | Admit: 2024-08-19 | Discharge: 2024-08-19 | Disposition: A | Discharge status: Home / Self Care | Source: Ambulatory Visit | Attending: Nurse Practitioner | Admitting: Nurse Practitioner

## 2024-08-19 DIAGNOSIS — Z1231 Encounter for screening mammogram for malignant neoplasm of breast: Secondary | ICD-10-CM | POA: Diagnosis present
